# Patient Record
Sex: Male | Born: 1965 | Race: Black or African American | Hispanic: No | Marital: Single | Smoking: Never smoker
Health system: Southern US, Community
[De-identification: ages and names within clinical notes are randomized; demographics above are authoritative.]

---

## 2010-05-10 ENCOUNTER — Emergency Department: Payer: Self-pay | Admitting: Emergency Medicine

## 2020-12-09 ENCOUNTER — Emergency Department
Admission: EM | Admit: 2020-12-09 | Discharge: 2020-12-09 | Disposition: A | Discharge status: Home / Self Care | Payer: No Typology Code available for payment source | Attending: Emergency Medicine | Admitting: Emergency Medicine

## 2020-12-09 ENCOUNTER — Encounter: Payer: Self-pay | Admitting: Emergency Medicine

## 2020-12-09 ENCOUNTER — Other Ambulatory Visit: Payer: Self-pay

## 2020-12-09 ENCOUNTER — Emergency Department: Payer: No Typology Code available for payment source

## 2020-12-09 DIAGNOSIS — R030 Elevated blood-pressure reading, without diagnosis of hypertension: Secondary | ICD-10-CM | POA: Insufficient documentation

## 2020-12-09 DIAGNOSIS — S4991XA Unspecified injury of right shoulder and upper arm, initial encounter: Secondary | ICD-10-CM | POA: Diagnosis present

## 2020-12-09 DIAGNOSIS — Y92481 Parking lot as the place of occurrence of the external cause: Secondary | ICD-10-CM | POA: Diagnosis not present

## 2020-12-09 DIAGNOSIS — S46911A Strain of unspecified muscle, fascia and tendon at shoulder and upper arm level, right arm, initial encounter: Secondary | ICD-10-CM | POA: Diagnosis not present

## 2020-12-09 MED ORDER — NAPROXEN 500 MG PO TABS
500.0000 mg | ORAL_TABLET | Freq: Two times a day (BID) | ORAL | 0 refills | Status: AC
Start: 2020-12-09 — End: ?

## 2020-12-09 NOTE — ED Notes (Signed)
Hypertensive in triage, does not take BP meds
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
